# Patient Record
Sex: Male | Born: 1950 | Marital: Married | Smoking: Never smoker
Health system: Southern US, Community
[De-identification: ages and names within clinical notes are randomized; demographics above are authoritative.]

## PROBLEM LIST (undated history)

## (undated) HISTORY — PX: BACK SURGERY: SHX140

---

## 2006-05-07 ENCOUNTER — Ambulatory Visit: Payer: Self-pay

## 2014-10-12 ENCOUNTER — Ambulatory Visit: Payer: Self-pay | Admitting: Specialist

## 2015-04-18 ENCOUNTER — Other Ambulatory Visit: Payer: Self-pay | Admitting: Specialist

## 2015-04-18 DIAGNOSIS — M25512 Pain in left shoulder: Secondary | ICD-10-CM

## 2015-05-01 ENCOUNTER — Ambulatory Visit
Admission: RE | Admit: 2015-05-01 | Discharge: 2015-05-01 | Disposition: A | Discharge status: Home / Self Care | Payer: Medicare Other | Source: Ambulatory Visit | Attending: Specialist | Admitting: Specialist

## 2015-05-01 DIAGNOSIS — M19012 Primary osteoarthritis, left shoulder: Secondary | ICD-10-CM | POA: Insufficient documentation

## 2015-05-01 DIAGNOSIS — M25512 Pain in left shoulder: Secondary | ICD-10-CM | POA: Diagnosis present

## 2017-05-24 DIAGNOSIS — M545 Low back pain: Secondary | ICD-10-CM | POA: Diagnosis not present

## 2017-05-24 DIAGNOSIS — M5416 Radiculopathy, lumbar region: Secondary | ICD-10-CM | POA: Diagnosis not present

## 2018-03-23 ENCOUNTER — Ambulatory Visit: Payer: Medicare HMO | Admitting: Adult Health

## 2018-03-23 VITALS — BP 116/78 | HR 70 | Resp 16 | Ht 66.0 in | Wt 138.8 lb

## 2018-03-23 DIAGNOSIS — Z23 Encounter for immunization: Secondary | ICD-10-CM

## 2018-03-23 DIAGNOSIS — Z0001 Encounter for general adult medical examination with abnormal findings: Secondary | ICD-10-CM | POA: Diagnosis not present

## 2018-03-23 NOTE — Progress Notes (Signed)
[facility] [address] [place], [place] [address]  Internal MEDICINE  Office Visit Note  Patient Name: Juan Williams  [id number]  [id number]  Date of Service: 03/23/2018  Chief Complaint  Patient presents with  . New Patient (Initial Visit)    need to re - establish care , need vaccination for yellow fever      HPI Pt is here for routine health maintenance examination.  Pt is here for physical and permission to receive yellow fever vaccine.  He denies complaints today. He is generally healthy.  He takes not daily medications.  He is planning a trip to [place] and needs medical clearance to receive yellow fever vaccine.   Current Medication: No outpatient encounter medications on file as of 03/23/2018.   No facility-administered encounter medications on file as of 03/23/2018.     Surgical History: Past Surgical History:  Procedure Laterality Date  . BACK SURGERY      Medical History: History reviewed. No pertinent past medical history.  Family History: Family History  Problem Relation Age of Onset  . Diabetes Mother       Review of Systems  Constitutional: Negative.  Negative for chills, fatigue and unexpected weight change.  HENT: Negative.  Negative for congestion, rhinorrhea, sneezing and sore throat.   Eyes: Negative for redness.  Respiratory: Negative.  Negative for cough, chest tightness and shortness of breath.   Cardiovascular: Negative.  Negative for chest pain and palpitations.  Gastrointestinal: Negative.  Negative for abdominal pain, constipation, diarrhea, nausea and vomiting.  Endocrine: Negative.   Genitourinary: Negative.  Negative for dysuria and frequency.  Musculoskeletal: Negative.  Negative for arthralgias, back pain, joint swelling and neck pain.  Skin: Negative.  Negative for rash.  Allergic/Immunologic: Negative.   Neurological: Negative.  Negative for tremors and numbness.  Hematological: Negative for adenopathy. Does not  bruise/bleed easily.  Psychiatric/Behavioral: Negative.  Negative for behavioral problems, sleep disturbance and suicidal ideas. The patient is not nervous/anxious.      Vital Signs: BP 116/78   Pulse 70   Resp 16   Ht 5\' 6"  (1.676 m)   Wt 138 lb 12.8 oz (63 kg)   SpO2 99%   BMI 22.40 kg/m    Physical Exam  Constitutional: He is oriented to person, place, and time. He appears well-developed and well-nourished. No distress.  HENT:  Head: Normocephalic and atraumatic.  Mouth/Throat: Oropharynx is clear and moist. No oropharyngeal exudate.  Eyes: Pupils are equal, round, and reactive to light. EOM are normal.  Neck: Normal range of motion. Neck supple. No JVD present. No tracheal deviation present. No thyromegaly present.  Cardiovascular: Normal rate, regular rhythm and normal heart sounds. Exam reveals no gallop and no friction rub.  No murmur heard. Pulmonary/Chest: Effort normal and breath sounds normal. No respiratory distress. He has no wheezes. He has no rales. He exhibits no tenderness.  Abdominal: Soft. There is no tenderness. There is no guarding.  Musculoskeletal: Normal range of motion.  Lymphadenopathy:    He has no cervical adenopathy.  Neurological: He is alert and oriented to person, place, and time. No cranial nerve deficit.  Skin: Skin is warm and dry. He is not diaphoretic.  Psychiatric: He has a normal mood and affect. His behavior is normal. Judgment and thought content normal.  Nursing note and vitals reviewed.    LABS: No results found for this or any previous visit (from the past 2160 hour(s)).  Assessment/Plan: 1. Encounter for general adult medical examination  with abnormal findings Pt in good health, with follow labs and treat accordingly.  - CBC with Differential/Platelet - Lipid Panel With LDL/HDL Ratio - TSH - T4, free - Comprehensive metabolic panel - Hemoglobin A1c - PSA  2. Encounter for vaccination Clearance to receive yellow fever  vaccine.   General Counseling: Yordy verbalizes understanding of the findings of todays visit and agrees with plan of treatment. I have discussed any further diagnostic evaluation that may be needed or ordered today. We also reviewed his medications today. he has been encouraged to call the office with any questions or concerns that should arise related to todays visit.   No orders of the defined types were placed in this encounter.   No orders of the defined types were placed in this encounter.   Time spent: 25  Minutes   This patient was seen by Blima LedgerAdam Rik Wadel AGNP-C in Collaboration with Dr Lyndon CodeFozia M Khan as a part of collaborative care agreement   Lyndon CodeFozia M Khan, MD  Internal Medicine

## 2018-03-23 NOTE — Patient Instructions (Signed)
TravelVaccine Information Vaccines, also called immunizations, can protect you from certain diseases. Vaccines can also prevent the spread of certain infections. It is important to see your health care provider or a travel medicine specialist 4-6 weeks before you travel. This allows time for recommended vaccines to take effect. It also provides enough time for you to get vaccines that must be given in a series over a period of days or weeks. Vaccines for travelers include:  Routine vaccines. These vaccines are standard.  Recommended travel vaccines. These vaccines are generally recommended before international travel.  Geographically required travel vaccines. These vaccines are necessary before travel to some countries or regions.  If it is less than 4 weeks before you leave, you should still see your health care provider. You might still benefit from vaccines or medicines. What are routine vaccines? Routine vaccines are shots that can protect you from common diseases in many parts of the world. Most routine vaccines are given at certain ages starting in childhood. Routine vaccines also include the annual flu (influenza) vaccine. It is important that you are up to date on your routine vaccines before you travel. You may be advised to get extra doses, also called booster vaccines, such as the Tdap (tetanus, diphtheria, and pertussis). What are recommended vaccines? Recommended travel vaccines change over time. Your health care provider can tell you what vaccines are recommended before your trip. The most common recommended vaccines before travel are hepatitis A and typhoid vaccines. Know your travel schedule when you visit your health care provider. The vaccines that are recommended before foreign travel will depend on several factors, such as:  The country or countries of travel.  Whether you will be traveling to rural areas.  How long you will be traveling.  The season of the year.  Your  age. Older adults should get a vaccine against a certain type of pneumonia (pneumococcal) and a vaccine against shingles (herpes zoster).  Your health status.  Your previous vaccines.  The annual influenza vaccine sometimes differs for the northern and southern hemispheres. You should:  Get both vaccines if you are traveling to the other hemisphere, and you have a chronic medical condition.  Get the vaccine shortly before or during the flu season, and only if the vaccine in your country differs from the vaccine in your destination country.  Get the other influenza vaccine either before leaving the country or shortly after arriving at the destination country.  What are geographically required vaccines? Children should be up to date with all of the recommended vaccinations. Parents should follow the standard vaccination guidelines that are recommended by the pediatrician. Some vaccines may be required during an ongoing outbreak of an infectious disease in a country or region. Your health care provider will be able to tell you about any outbreaks and required vaccines. Some examples of required vaccines include: Yellow fever vaccine  Proof of yellow fever immunization is currently required for most people before traveling to certain countries in [place] and [place].  If proof of immunization is incomplete or inaccurate, you could be quarantined, denied entry, or given another dose of vaccine at the travel site.  This vaccine can only be obtained at approved centers.  You should get the yellow fever vaccine at least 10 days before your trip.  After 10 days, most people show immunity to yellow fever.  If it has been longer than 10 years since you received the yellow fever vaccine, another dose is required.  Meningococcal vaccine    Meningococcal immunization may be required prior to travel to parts of [place] and [place].  Proof of meningococcal immunization is required by the  [place] [employer] for any person older than age 2 who is taking part in hajj or umrah.  Visas for traveling to take part in hajj or umrah will not even be issued until there is proof of immunization. You should get this vaccine at least 10 days before your trip.  After 10 days, most people show immunity.  If it has been longer than 3 years since your last immunization, another dose is required.  Some travel circumstances may require additional vaccination with the following vaccines:  Hepatitis B.  Rabies.  Tick-borne encephalitis.  Malaria.  Where to find more information:  Centers for Disease Control and Prevention (CDC): www.cdc.gov  World Health Organization (WHO): www.who.int This information is not intended to replace advice given to you by your health care provider. Make sure you discuss any questions you have with your health care provider. Document Released: 07/22/2009 Document Revised: 04/28/2016 Document Reviewed: 01/07/2016 Elsevier Interactive Patient Education  2018 Elsevier Inc.  

## 2018-03-24 ENCOUNTER — Encounter: Payer: Self-pay | Admitting: Adult Health

## 2018-03-25 LAB — LIPID PANEL WITH LDL/HDL RATIO
CHOLESTEROL TOTAL: 185 mg/dL (ref 100–199)
HDL: 49 mg/dL (ref >39)
LDL CALC: 119 mg/dL — AB (ref 0–99)
LDl/HDL Ratio: 2.4 ratio (ref 0.0–3.6)
TRIGLYCERIDES: 87 mg/dL (ref 0–149)
VLDL Cholesterol Cal: 17 mg/dL (ref 5–40)

## 2018-03-25 LAB — COMPREHENSIVE METABOLIC PANEL
A/G RATIO: 1.6 (ref 1.2–2.2)
ALT: 13 IU/L (ref 0–44)
AST: 13 IU/L (ref 0–40)
Albumin: 4.4 g/dL (ref 3.6–4.8)
Alkaline Phosphatase: 68 IU/L (ref 39–117)
BUN/Creatinine Ratio: 10 (ref 10–24)
BUN: 12 mg/dL (ref 8–27)
Bilirubin Total: 0.4 mg/dL (ref 0.0–1.2)
CALCIUM: 9.5 mg/dL (ref 8.6–10.2)
CO2: 22 mmol/L (ref 20–29)
Chloride: 103 mmol/L (ref 96–106)
Creatinine, Ser: 1.18 mg/dL (ref 0.76–1.27)
GFR, EST AFRICAN AMERICAN: 73 mL/min/{1.73_m2} (ref >59)
GFR, EST NON AFRICAN AMERICAN: 63 mL/min/{1.73_m2} (ref >59)
Globulin, Total: 2.8 g/dL (ref 1.5–4.5)
Glucose: 104 mg/dL — ABNORMAL HIGH (ref 65–99)
POTASSIUM: 5 mmol/L (ref 3.5–5.2)
Sodium: 140 mmol/L (ref 134–144)
TOTAL PROTEIN: 7.2 g/dL (ref 6.0–8.5)

## 2018-03-25 LAB — CBC WITH DIFFERENTIAL/PLATELET
BASOS ABS: 0 10*3/uL (ref 0.0–0.2)
Basos: 0 %
EOS (ABSOLUTE): 0.3 10*3/uL (ref 0.0–0.4)
Eos: 6 %
HEMOGLOBIN: 13 g/dL (ref 13.0–17.7)
Hematocrit: 39.3 % (ref 37.5–51.0)
IMMATURE GRANS (ABS): 0 10*3/uL (ref 0.0–0.1)
IMMATURE GRANULOCYTES: 0 %
LYMPHS ABS: 1.4 10*3/uL (ref 0.7–3.1)
LYMPHS: 28 %
MCH: 29.3 pg (ref 26.6–33.0)
MCHC: 33.1 g/dL (ref 31.5–35.7)
MCV: 89 fL (ref 79–97)
MONOCYTES: 8 %
Monocytes Absolute: 0.4 10*3/uL (ref 0.1–0.9)
NEUTROS PCT: 58 %
Neutrophils Absolute: 2.9 10*3/uL (ref 1.4–7.0)
Platelets: 220 10*3/uL (ref 150–450)
RBC: 4.43 x10E6/uL (ref 4.14–5.80)
RDW: 13 % (ref 12.3–15.4)
WBC: 5 10*3/uL (ref 3.4–10.8)

## 2018-03-25 LAB — T4, FREE: FREE T4: 1.39 ng/dL (ref 0.82–1.77)

## 2018-03-25 LAB — TSH: TSH: 4.09 u[IU]/mL (ref 0.450–4.500)

## 2018-03-25 LAB — PSA: PROSTATE SPECIFIC AG, SERUM: 1.5 ng/mL (ref 0.0–4.0)

## 2018-04-20 ENCOUNTER — Telehealth: Payer: Self-pay

## 2018-04-20 ENCOUNTER — Encounter: Payer: Self-pay | Admitting: Adult Health

## 2018-04-20 ENCOUNTER — Ambulatory Visit: Payer: Medicare HMO | Admitting: Adult Health

## 2018-04-20 VITALS — BP 102/66 | HR 71 | Resp 16 | Ht 66.0 in | Wt 139.6 lb

## 2018-04-20 DIAGNOSIS — Z008 Encounter for other general examination: Secondary | ICD-10-CM | POA: Diagnosis not present

## 2018-04-20 NOTE — Telephone Encounter (Signed)
Faxed order for cologuard

## 2018-04-20 NOTE — Progress Notes (Signed)
[facility] [address] [place], [place] [address]  Internal MEDICINE  Office Visit Note  Patient Name: Juan Williams  [id number]  [id number]  Date of Service: 05/02/2018  Chief Complaint  Patient presents with  . Medical Management of Chronic Issues    Follow up labs   . Quality Metric Gaps    colonoscopy     HPI Pt here for follow up on lab work and to address metric gaps.  His lab work is benign, and no interventions are necessary at this time.  Also, we discussed Colonoscopy, and he has consented to cologuard if insurance is agreeable. He denies changes in his health since last visit, and he is very excited about going to [place] in the coming weeks.   Current Medication: No outpatient encounter medications on file as of 04/20/2018.   No facility-administered encounter medications on file as of 04/20/2018.     Surgical History: Past Surgical History:  Procedure Laterality Date  . BACK SURGERY      Medical History: History reviewed. No pertinent past medical history.  Family History: Family History  Problem Relation Age of Onset  . Diabetes Mother     Social History   Socioeconomic History  . Marital status: Married    Spouse name: Not on file  . Number of children: Not on file  . Years of education: Not on file  . Highest education level: Not on file  Occupational History  . Not on file  Social Needs  . Financial resource strain: Not on file  . Food insecurity:    Worry: Not on file    Inability: Not on file  . Transportation needs:    Medical: Not on file    Non-medical: Not on file  Tobacco Use  . Smoking status: Never Smoker  . Smokeless tobacco: Never Used  Substance and Sexual Activity  . Alcohol use: Never    Frequency: Never  . Drug use: Never  . Sexual activity: Not on file  Lifestyle  . Physical activity:    Days per week: Not on file    Minutes per session: Not on file  . Stress: Not on file  Relationships  . Social  connections:    Talks on phone: Not on file    Gets together: Not on file    Attends religious service: Not on file    Active member of club or organization: Not on file    Attends meetings of clubs or organizations: Not on file    Relationship status: Not on file  . Intimate partner violence:    Fear of current or ex partner: Not on file    Emotionally abused: Not on file    Physically abused: Not on file    Forced sexual activity: Not on file  Other Topics Concern  . Not on file  Social History Narrative  . Not on file      Review of Systems  Constitutional: Negative.  Negative for chills, fatigue and unexpected weight change.  HENT: Negative.  Negative for congestion, rhinorrhea, sneezing and sore throat.   Eyes: Negative for redness.  Respiratory: Negative.  Negative for cough, chest tightness and shortness of breath.   Cardiovascular: Negative.  Negative for chest pain and palpitations.  Gastrointestinal: Negative.  Negative for abdominal pain, constipation, diarrhea, nausea and vomiting.  Endocrine: Negative.   Genitourinary: Negative.  Negative for dysuria and frequency.  Musculoskeletal: Negative.  Negative for arthralgias, back pain, joint swelling and neck  pain.  Skin: Negative.  Negative for rash.  Allergic/Immunologic: Negative.   Neurological: Negative.  Negative for tremors and numbness.  Hematological: Negative for adenopathy. Does not bruise/bleed easily.  Psychiatric/Behavioral: Negative.  Negative for behavioral problems, sleep disturbance and suicidal ideas. The patient is not nervous/anxious.     Vital Signs: BP 102/66   Pulse 71   Resp 16   Ht 5\' 6"  (1.676 m)   Wt 139 lb 9.6 oz (63.3 kg)   SpO2 98%   BMI 22.53 kg/m    Physical Exam  Constitutional: He is oriented to person, place, and time. He appears well-developed and well-nourished. No distress.  HENT:  Head: Normocephalic and atraumatic.  Mouth/Throat: Oropharynx is clear and moist. No  oropharyngeal exudate.  Eyes: Pupils are equal, round, and reactive to light. EOM are normal.  Neck: Normal range of motion. Neck supple. No JVD present. No tracheal deviation present. No thyromegaly present.  Cardiovascular: Normal rate, regular rhythm and normal heart sounds. Exam reveals no gallop and no friction rub.  No murmur heard. Pulmonary/Chest: Effort normal and breath sounds normal. No respiratory distress. He has no wheezes. He has no rales. He exhibits no tenderness.  Abdominal: Soft. There is no tenderness. There is no guarding.  Musculoskeletal: Normal range of motion.  Lymphadenopathy:    He has no cervical adenopathy.  Neurological: He is alert and oriented to person, place, and time. No cranial nerve deficit.  Skin: Skin is warm and dry. He is not diaphoretic.  Psychiatric: He has a normal mood and affect. His behavior is normal. Judgment and thought content normal.  Nursing note and vitals reviewed.   Assessment/Plan: 1. Encounter for medical assessment Labs reviewed with patient.  No further orders at this time.  Pt will follow up in one year for physical, or before if needed. Cologuard ordered today. Pt will do when he returns from [place].    General Counseling: Juan Williams verbalizes understanding of the findings of todays visit and agrees with plan of treatment. I have discussed any further diagnostic evaluation that may be needed or ordered today. We also reviewed his medications today. he has been encouraged to call the office with any questions or concerns that should arise related to todays visit.   Time spent: 25 Minutes   This patient was seen by Blima Ledger AGNP-C in Collaboration with Dr Lyndon Code as a part of collaborative care agreement    Dr Lyndon Code Internal medicine

## 2018-04-20 NOTE — Patient Instructions (Signed)
Food Poisoning and Traveling Food poisoning is an illness caused by organisms present in something you ate or drank. Some types of food poisoning trigger symptoms quickly. Others may take 1-2 weeks for symptoms to appear. Symptoms of food poisoning include:  Diarrhea.  Cramping.  Fever.  Vomiting.  Dizziness.  Aches and pains.  Before you travel, learn as much as you can about the foodborne illnesses that are common in the areas where you are going. The risk for food poisoning varies from country to country and from one region of the world to another. Countries with a low risk include:  The [place].  [place].  [place].  [place].  [place].  Some countries in [place].  Countries with a mid-range risk include:  Countries in [place].  [place].  Some [place] islands.  Countries with a high risk include:  Countries in Asia.  Countries in the [place].  Countries in [place].  [place].  Countries in [place] and [place].  What types of illness can be passed through food and drinks? Most cases of food poisoning are caused by bacteria or viruses, such as:  E. coli.  Campylobacteriosis.  Shigellosis.  Salmonellosis.  Norovirus.  Rotavirus.  Astrovirus.  Food poisoning can also be caused by some microscopic parasites. These are organisms that live off of another larger organism. Illness caused by parasites can take 1-2 weeks to appear and may last several months. The illnesses include:  Giardiasis.  Amebiasis.  Cyclosporiasis.  Cryptosporidiosis.  Medicines are available to treat these infections. How can I decrease my risk of food poisoning while traveling? Good hand hygiene always helps protect your health. Carry small bottles of alcohol-based hand sanitizer. Use it to clean your hands before you eat. Also follow these basic guidelines for eating and drinking while traveling. Foods that are generally safe to  eat:  Food that is thoroughly cooked.  Food that is served hot.  Hard-boiled eggs.  Fruits and vegetables you wash and peel yourself.  Milk or cheese that is treated with high heat (pasteurized).  Foods to avoid:  Raw or undercooked foods.  Raw or runny eggs.  Food that is not hot (such as food that has been on a buffet or picnic table for a while).  Raw fruits or vegetables that have not been washed and peeled.  Other items made with fresh vegetables or fruits, like salad and salsa.  Milk or cheese that is not pasteurized.  Meat from local animals, such as monkeys and bats.  Drinks that are generally safe:  Bottled waters, soda, or sports drinks.  Drinks you know were sealed until you opened them.  Water you know has been treated, boiled, or filtered to remove microorganisms.  Ice from treated or bottled water.  Drinks made with boiling water, such as tea or coffee.  Pasteurized milk.  Drinks to avoid:  Water from the tap or a well.  Water from a fresh water source, such as a stream.  Ice from a tap, well, or fresh water source.  Beverages that include water from a well, tap, or fresh water source.  Milk that is not pasteurized.  Beverages from soda fountains.  What should I do if I think I have developed food poisoning while traveling?  If you have been vomiting or have diarrhea, drink water or other fluids to replace what you lost.  Take over-the-counter medicine to stop diarrhea. Consider packing a supply of antidiarrheal medicine to take with you.    Contact your health care provider if your symptoms do not clear up after a few days. How is food poisoning treated? Most cases of food poisoning go away without treatment within 48 hours. Food poisoning caused by bacteria may be treated with antibiotic medicines.Viruses cannot be treated with antibiotics.Illnesses caused by parasites might respond to antiparasitic medicines. You can also treat symptoms  of food poisoning with medicines to:  Prevent or slow diarrhea (antidiarrheals).  Rehydrate your body. Oral rehydration therapy (ORT) helps your body replace fluids and electrolytes lost in diarrhea or vomit.  Treat rashes caused by diarrhea using hydrocortisone cream.  Should I be proactively treated for food poisoning before I travel? Talk to your health care provider about your personal risk for contracting food poisoning while traveling. Discuss the foodborne illnesses that are common in the areas you plan to visit. Make sure you understand how to use any medicines your health care provider recommends. Some medicines can help prevent food poisoning. These include:  Bismuth subsalicylate (BSS). This is an ingredient in over-the-counter antidiarrheal medicines. It might help some adult travelers prevent illness.  Probiotics or "good" bacteria. Taking probiotics might help prevent some illnesses.  Antibiotics. These protect against bacteria only. Taking antibiotics to prevent food poisoning is usually suggested only for people who have a compromised immune system.  How can I learn more?  Visit a travel medicine clinic or speak with a health care provider who specializes in travel medicine as soon as you know your travel plans.  Check the Travelers' Health section on the website of the Centers for Disease Control and Prevention (CDC): http://wwwnc.cdc.gov/travel This information is not intended to replace advice given to you by your health care provider. Make sure you discuss any questions you have with your health care provider. Document Released: 10/24/2002 Document Revised: 12/31/2015 Document Reviewed: 11/10/2013 Elsevier Interactive Patient Education  2018 Elsevier Inc.  

## 2018-06-22 DIAGNOSIS — Z1212 Encounter for screening for malignant neoplasm of rectum: Secondary | ICD-10-CM | POA: Diagnosis not present

## 2018-06-22 DIAGNOSIS — Z1211 Encounter for screening for malignant neoplasm of colon: Secondary | ICD-10-CM | POA: Diagnosis not present

## 2018-07-05 ENCOUNTER — Telehealth: Payer: Self-pay

## 2018-07-05 ENCOUNTER — Encounter: Payer: Self-pay | Admitting: Adult Health

## 2018-07-05 LAB — COLOGUARD

## 2018-07-05 NOTE — Telephone Encounter (Signed)
CALLED PT IN REGARDS TO HIS COLOGUARD RESULTS. LMOM TO LET HIM KNOW THEY ARE NEGATIVE AND IF HE HAD ANY QUESTIONS TO GIVE US A CALL BACK.

## 2018-08-23 ENCOUNTER — Ambulatory Visit: Payer: Self-pay | Admitting: Nurse Practitioner

## 2018-09-12 DIAGNOSIS — M545 Low back pain: Secondary | ICD-10-CM | POA: Diagnosis not present

## 2018-09-12 DIAGNOSIS — M542 Cervicalgia: Secondary | ICD-10-CM | POA: Diagnosis not present

## 2018-09-12 DIAGNOSIS — M5416 Radiculopathy, lumbar region: Secondary | ICD-10-CM | POA: Diagnosis not present

## 2018-09-12 DIAGNOSIS — M79601 Pain in right arm: Secondary | ICD-10-CM | POA: Diagnosis not present

## 2018-09-12 DIAGNOSIS — M25511 Pain in right shoulder: Secondary | ICD-10-CM | POA: Diagnosis not present

## 2018-09-12 DIAGNOSIS — Z719 Counseling, unspecified: Secondary | ICD-10-CM | POA: Diagnosis not present

## 2018-09-12 DIAGNOSIS — Z9889 Other specified postprocedural states: Secondary | ICD-10-CM | POA: Diagnosis not present

## 2018-09-30 ENCOUNTER — Other Ambulatory Visit: Payer: Self-pay | Admitting: Specialist

## 2018-09-30 ENCOUNTER — Ambulatory Visit
Admission: RE | Admit: 2018-09-30 | Discharge: 2018-09-30 | Disposition: A | Discharge status: Home / Self Care | Payer: Medicare HMO | Source: Ambulatory Visit | Attending: Specialist | Admitting: Specialist

## 2018-09-30 ENCOUNTER — Ambulatory Visit
Admission: RE | Admit: 2018-09-30 | Discharge: 2018-09-30 | Disposition: A | Discharge status: Home / Self Care | Payer: Medicare HMO | Attending: Specialist | Admitting: Specialist

## 2018-09-30 DIAGNOSIS — M25511 Pain in right shoulder: Secondary | ICD-10-CM | POA: Diagnosis not present

## 2018-12-12 DIAGNOSIS — M5416 Radiculopathy, lumbar region: Secondary | ICD-10-CM | POA: Diagnosis not present

## 2018-12-12 DIAGNOSIS — M961 Postlaminectomy syndrome, not elsewhere classified: Secondary | ICD-10-CM | POA: Diagnosis not present

## 2018-12-12 DIAGNOSIS — Z719 Counseling, unspecified: Secondary | ICD-10-CM | POA: Diagnosis not present

## 2018-12-14 DIAGNOSIS — M545 Low back pain: Secondary | ICD-10-CM | POA: Diagnosis not present

## 2019-01-23 DIAGNOSIS — M25511 Pain in right shoulder: Secondary | ICD-10-CM | POA: Diagnosis not present

## 2019-01-23 DIAGNOSIS — M545 Low back pain: Secondary | ICD-10-CM | POA: Diagnosis not present

## 2019-01-23 DIAGNOSIS — Z719 Counseling, unspecified: Secondary | ICD-10-CM | POA: Diagnosis not present

## 2019-01-31 ENCOUNTER — Other Ambulatory Visit: Payer: Self-pay | Admitting: Specialist

## 2019-01-31 DIAGNOSIS — M25511 Pain in right shoulder: Secondary | ICD-10-CM

## 2019-02-13 ENCOUNTER — Ambulatory Visit
Admission: RE | Admit: 2019-02-13 | Discharge: 2019-02-13 | Disposition: A | Discharge status: Home / Self Care | Payer: Medicare HMO | Source: Ambulatory Visit | Attending: Specialist | Admitting: Specialist

## 2019-02-13 ENCOUNTER — Other Ambulatory Visit: Payer: Self-pay

## 2019-02-13 DIAGNOSIS — M25511 Pain in right shoulder: Secondary | ICD-10-CM

## 2019-05-04 ENCOUNTER — Ambulatory Visit: Payer: Medicare HMO | Admitting: Nurse Practitioner

## 2019-05-10 ENCOUNTER — Ambulatory Visit: Payer: Medicare HMO | Admitting: Adult Health

## 2019-09-06 ENCOUNTER — Telehealth: Payer: Self-pay

## 2019-09-06 NOTE — Telephone Encounter (Signed)
LMOM FOR PATIENT TO CONTACT OFFICE TO SCHEDULE AWV. 

## 2020-11-14 IMAGING — MR MRI OF THE RIGHT SHOULDER WITHOUT CONTRAST
4 of 5 series · 33 of 40 positions shown · non-contrast
Comparison: None.

CLINICAL DATA: Right shoulder pain and limited range of motion for
8 months, but worsening over the last 2 months.

EXAM:
MRI OF THE RIGHT SHOULDER WITHOUT CONTRAST
TECHNIQUE: Multiplanar, multisequence MR imaging of the shoulder was performed.
No intravenous contrast was administered.

[Series 5: PD fat-sat · axial · right · 4.0mm · 0.55mm/px · z∈[-64,+59]mm · 11 of 28 slices shown (1 of 2)]
[im 1/28]
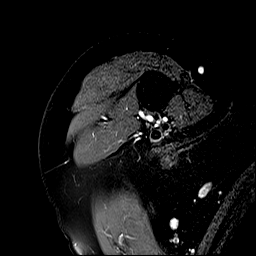
[im 3/28]
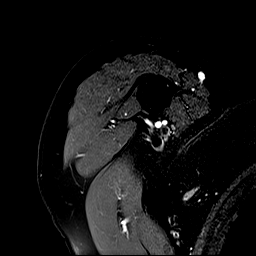
[im 6/28]
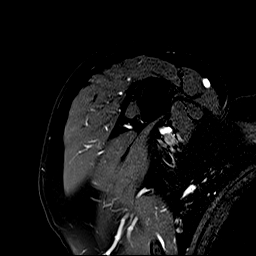
[im 9/28]
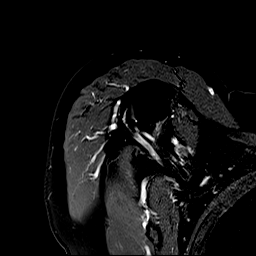
[im 11/28]
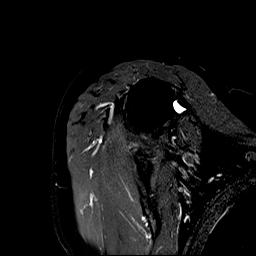
[im 14/28]
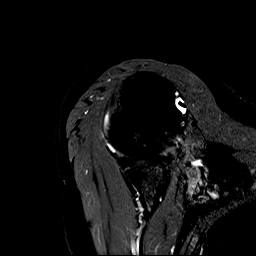
[im 17/28]
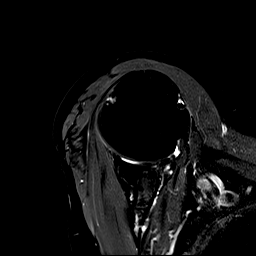
[im 19/28]
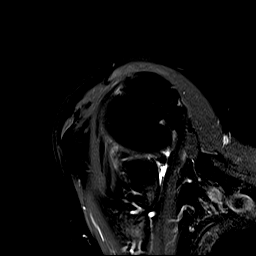
[im 22/28]
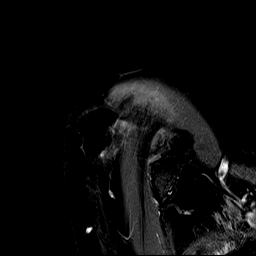
[im 25/28]
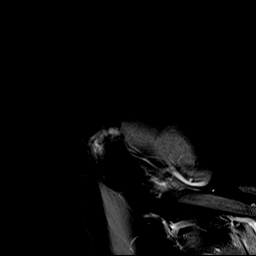
[im 28/28]
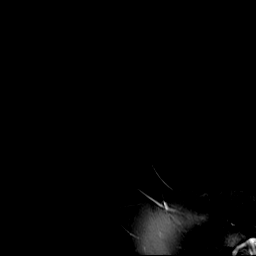

[Series 6: PD fat-sat · oblique · right · 4.0mm · 0.44mm/px · 8 of 20 slices shown (2 of 2)]
[im 1/20]
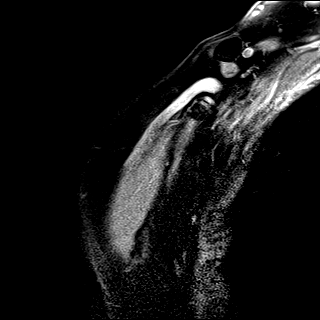
[im 3/20]
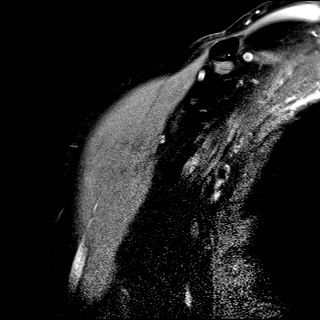
[im 6/20]
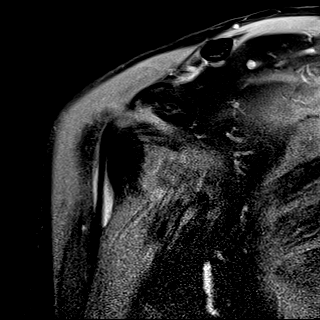
[im 9/20]
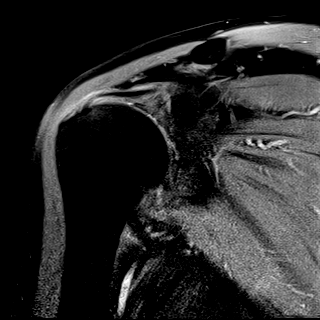
[im 11/20]
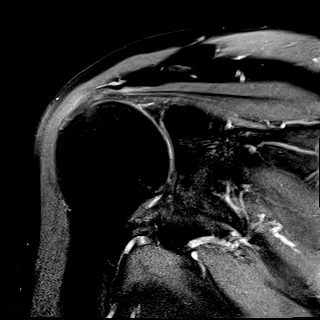
[im 14/20]
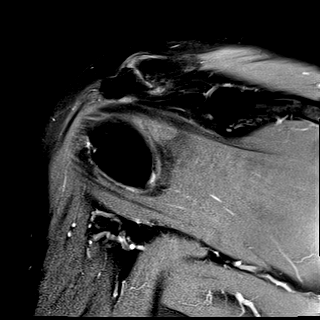
[im 17/20]
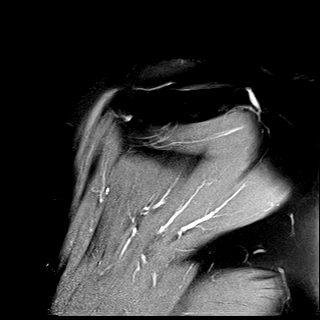
[im 20/20]
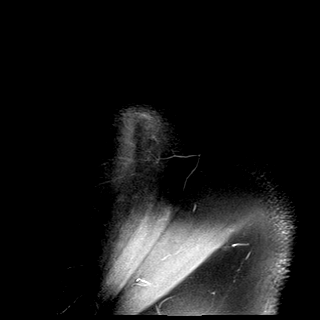

[Series 7: T2 fat-sat · oblique · right · 4.0mm · 0.44mm/px · 7 of 20 slices shown (1 of 2)]
[im 1/20]
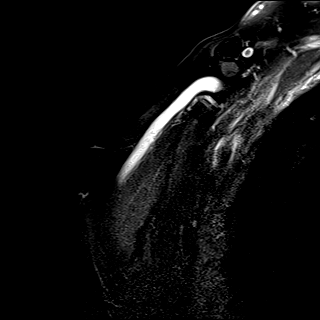
[im 4/20]
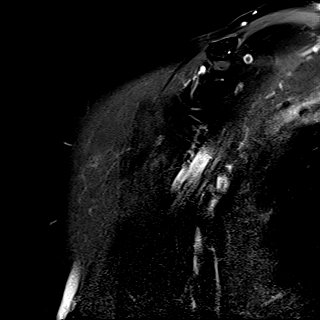
[im 7/20]
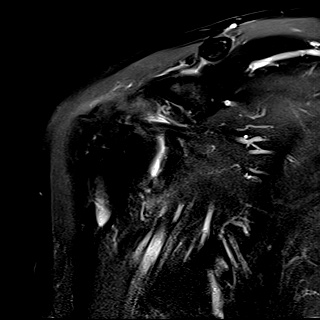
[im 10/20]
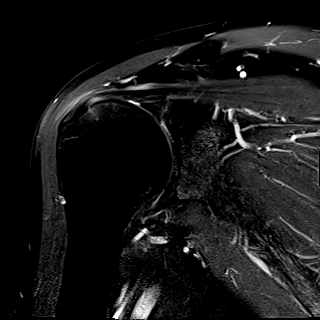
[im 13/20]
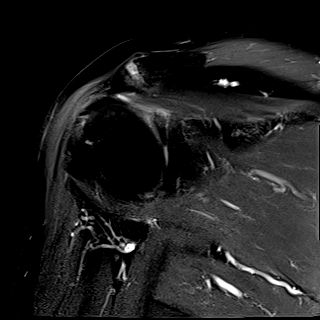
[im 16/20]
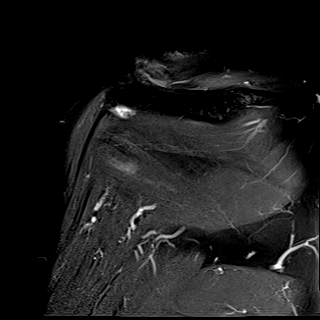
[im 20/20]
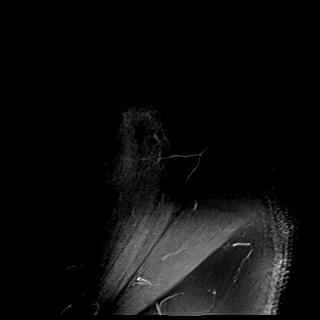

[Series 8: T2 fat-sat · oblique · right · 4.0mm · 0.23mm/px · 7 of 20 slices shown (2 of 2)]
[im 1/20]
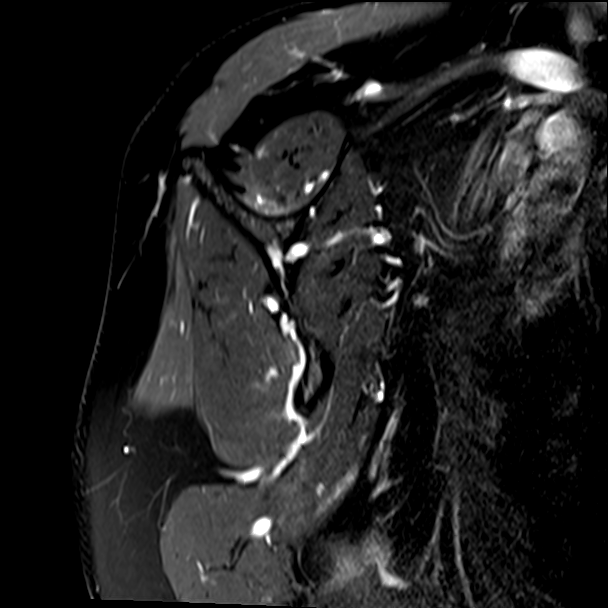
[im 4/20]
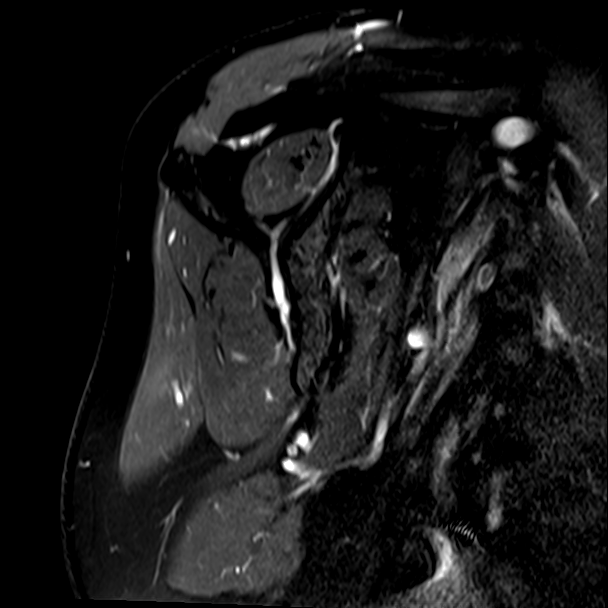
[im 7/20]
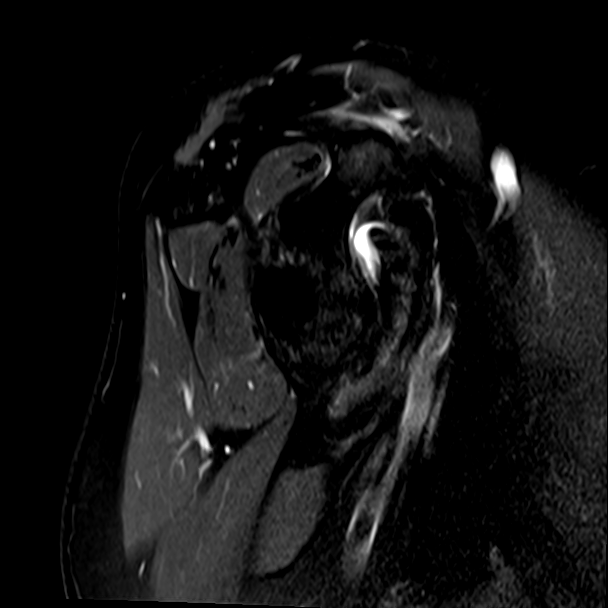
[im 10/20]
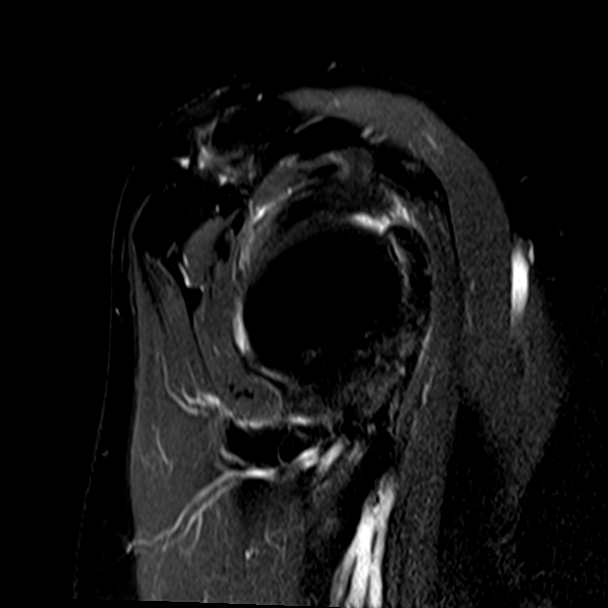
[im 13/20]
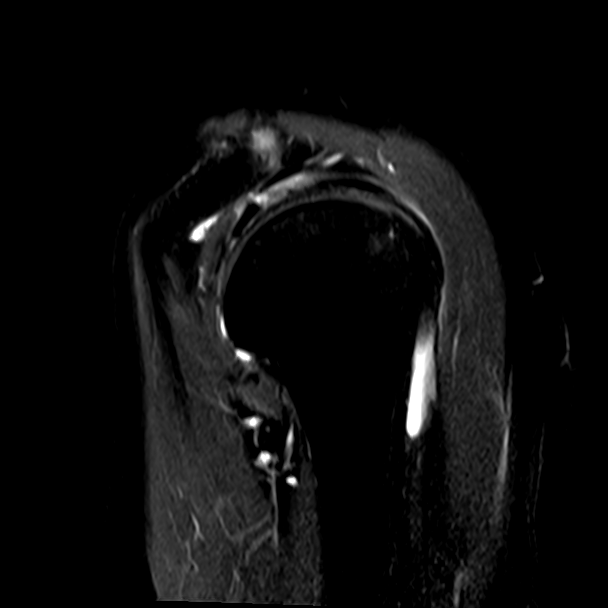
[im 16/20]
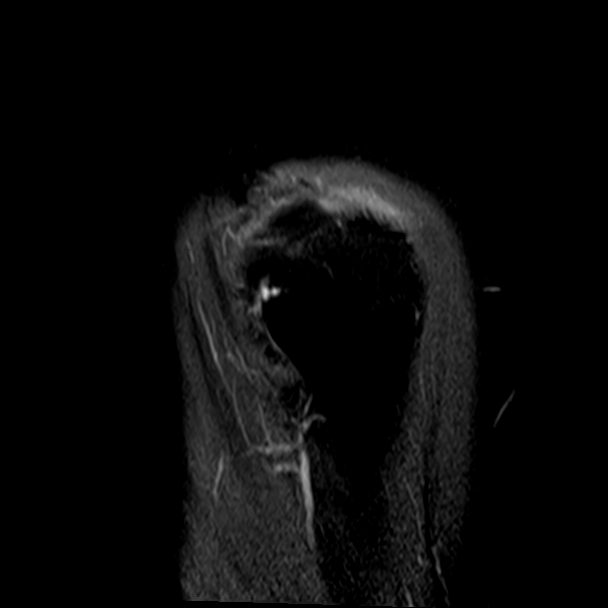
[im 20/20]
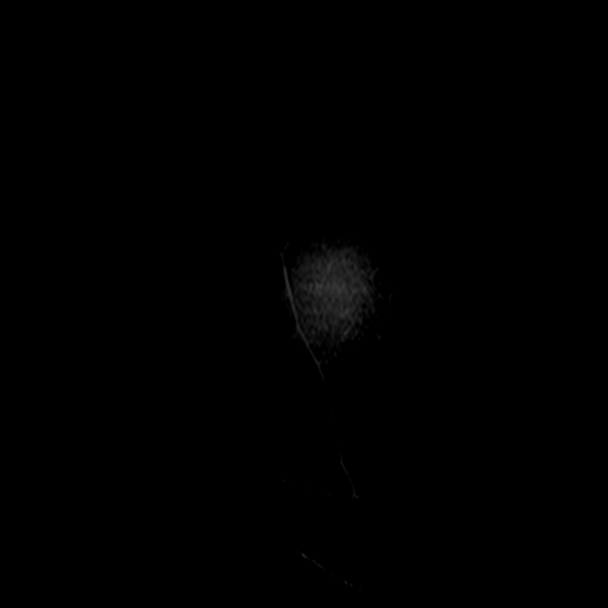

[33 of 40 positions shown; findings below may reference images not displayed]

FINDINGS: Rotator cuff:  Mild supraspinatus tendinopathy.

Muscles:  Unremarkable

Biceps long head:  Unremarkable

Acromioclavicular Joint: Mild spurring with mild subcortical marrow
edema and a small amount of fluid signal in the joint as well as a
small amount of fluid signal in the soft tissues just above the
joint. Type I acromion. Physiologic amount of fluid in the
subacromial subdeltoid bursa.

Glenohumeral Joint: Mild degenerative chondral thinning.

Labrum:  Grossly unremarkable

Bones: No significant extra-articular osseous abnormalities
identified.

Other: No supplemental non-categorized findings.
IMPRESSION: 1. Mild to moderate degenerative AC joint arthropathy. Mild
degenerative chondral thinning in the glenohumeral joint.
2. Mild supraspinatus tendinopathy.
3. No full-thickness rotator cuff tear is identified.
